# Patient Record
Sex: Male | Born: 1980 | Race: Black or African American | Hispanic: No | Marital: Single | Smoking: Current some day smoker
Health system: Southern US, Community
[De-identification: ages and names within clinical notes are randomized; demographics above are authoritative.]

## PROBLEM LIST (undated history)

## (undated) DIAGNOSIS — F319 Bipolar disorder, unspecified: Secondary | ICD-10-CM

## (undated) HISTORY — DX: Bipolar disorder, unspecified: F31.9

---

## 2000-12-14 ENCOUNTER — Emergency Department: Admit: 2000-12-14 | Payer: Self-pay | Source: Emergency Department | Admitting: Pediatric Emergency Medicine

## 2005-07-18 ENCOUNTER — Emergency Department: Admit: 2005-07-18 | Payer: Self-pay | Source: Emergency Department | Admitting: Emergency Medicine

## 2005-07-23 ENCOUNTER — Emergency Department: Admit: 2005-07-23 | Payer: Self-pay | Source: Emergency Department | Admitting: Emergency Medicine

## 2006-08-30 ENCOUNTER — Emergency Department: Admit: 2006-08-30 | Payer: Self-pay | Source: Emergency Department | Admitting: Emergency Medicine

## 2006-08-30 LAB — CBC WITH AUTO DIFFERENTIAL CERNER
Basophils Absolute: 0 /mm3 (ref 0.0–0.2)
Basophils: 0 % (ref 0–2)
Eosinophils Absolute: 0.1 /mm3 (ref 0.0–0.2)
Eosinophils: 1 % (ref 0–5)
Granulocytes Absolute: 11.1 /mm3 — ABNORMAL HIGH (ref 1.8–8.1)
Hematocrit: 42.3 % (ref 42.0–52.0)
Hgb: 14.8 G/DL (ref 13.0–17.0)
Lymphocytes Absolute: 0.9 /mm3 (ref 0.5–4.4)
Lymphocytes: 7 % — ABNORMAL LOW (ref 15–41)
MCH: 31.1 PG (ref 28.0–32.0)
MCHC: 34.9 G/DL (ref 32.0–36.0)
MCV: 89 FL (ref 80.0–100.0)
MPV: 8.6 FL (ref 7.4–10.4)
Monocytes Absolute: 0.8 /mm3 (ref 0.0–1.2)
Monocytes: 6 % (ref 0–11)
Neutrophils %: 86 % — ABNORMAL HIGH (ref 52–75)
Platelets: 191 /mm3 (ref 140–400)
RBC: 4.74 /mm3 (ref 4.70–6.00)
RDW: 12.6 % (ref 11.5–15.0)
WBC: 12.9 /mm3 — ABNORMAL HIGH (ref 3.5–10.8)

## 2006-08-30 LAB — MONONUCLEOSIS SCREEN: Mono Screen: NEGATIVE

## 2011-03-12 LAB — ECG 12-LEAD
Atrial Rate: 61 {beats}/min
P Axis: 49 degrees
P-R Interval: 146 ms
Q-T Interval: 406 ms
QRS Duration: 78 ms
QTC Calculation (Bezet): 408 ms
R Axis: 74 degrees
T Axis: 28 degrees
Ventricular Rate: 61 {beats}/min

## 2015-03-03 ENCOUNTER — Encounter (HOSPITAL_COMMUNITY): Payer: Self-pay | Admitting: Emergency Medicine

## 2015-03-03 ENCOUNTER — Emergency Department (HOSPITAL_COMMUNITY)
Admission: EM | Admit: 2015-03-03 | Discharge: 2015-03-03 | Disposition: A | Discharge status: Home / Self Care | Payer: Self-pay | Attending: Emergency Medicine | Admitting: Emergency Medicine

## 2015-03-03 ENCOUNTER — Emergency Department (HOSPITAL_COMMUNITY): Payer: Self-pay

## 2015-03-03 DIAGNOSIS — M7918 Myalgia, other site: Secondary | ICD-10-CM

## 2015-03-03 DIAGNOSIS — M79671 Pain in right foot: Secondary | ICD-10-CM | POA: Insufficient documentation

## 2015-03-03 DIAGNOSIS — Z87891 Personal history of nicotine dependence: Secondary | ICD-10-CM | POA: Insufficient documentation

## 2015-03-03 DIAGNOSIS — M79642 Pain in left hand: Secondary | ICD-10-CM | POA: Insufficient documentation

## 2015-03-03 DIAGNOSIS — M7989 Other specified soft tissue disorders: Secondary | ICD-10-CM | POA: Insufficient documentation

## 2015-03-03 MED ORDER — IBUPROFEN 400 MG PO TABS
800.0000 mg | ORAL_TABLET | Freq: Once | ORAL | Status: AC
  Administered 2015-03-03: 800 mg via ORAL
  Filled 2015-03-03: qty 2

## 2015-03-03 NOTE — ED Notes (Signed)
Pt c/o pain in right foot  X's 2 days.  Also c/o bil hand pain.  Pt st's he boxes and works out everyday.

## 2015-03-03 NOTE — ED Provider Notes (Signed)
CSN: [id number]     Arrival date & time 03/03/15  1839 History  By signing my name below, I, Randy George, attest that this documentation has been prepared under the direction and in the presence of [employer], PA-C. Electronically Signed: Ronney George, ED Scribe. 03/03/2015. 8:19 PM.    Chief Complaint  Patient presents with  . Foot Pain   The history is provided by the patient. No language interpreter was used.    HPI Comments: Randy George is a 34 y.o. male who presents to the Emergency Department complaining of constant, sharp right ankle pain that began 2 days ago. He denies any known specific trauma or injury but does state that he boxes and works out daily. He notes an associated area of redness to to the top of his right foot that has been ongoing for 2 weeks. He also reports that he is having difficulty flexing and extending his foot and has had increased discomfort with ambulation.   History reviewed. No pertinent past medical history. History reviewed. No pertinent past surgical history. No family history on file. Social History  Substance Use Topics  . Smoking status: Former Games developer  . Smokeless tobacco: None  . Alcohol Use: No    Review of Systems A complete 10 system review of systems was obtained and all systems are negative except as noted in the HPI and PMH.    Allergies  Review of patient's allergies indicates not on file.  Home Medications   Prior to Admission medications   Not on File   BP 128/88 mmHg  Pulse 98  Temp(Src) 98 F (36.7 C) (Oral)  Resp 22  Ht  (1.803 m)  Wt 185 lb 1 oz (83.944 kg)  BMI 25.82 kg/m2  SpO2 97% Physical Exam  Constitutional: He is oriented to person, place, and time. He appears well-developed and well-nourished. No distress.  HENT:  Head: Normocephalic and atraumatic.  Eyes: Conjunctivae and EOM are normal.  Neck: Normal range of motion. Neck supple. No tracheal deviation present.  Cardiovascular: Normal rate,  regular rhythm and intact distal pulses.   Pulmonary/Chest: Effort normal and breath sounds normal. No respiratory distress. He has no wheezes. He has no rales. He exhibits no tenderness.  Abdominal: Soft. Bowel sounds are normal. He exhibits no distension and no mass. There is no tenderness. There is no rebound and no guarding.  Musculoskeletal: Normal range of motion. He exhibits tenderness.  Neurological: He is alert and oriented to person, place, and time.  Skin: Skin is warm and dry.     Patient has erythema in the area of tenderness on the foot, is distally neurovascular intact, excellent range of motion. Patient is also concerned about a swelling to the dorsum of the left hand. The swelling is a vein. There is no overlying signs of infection, excellent range of motion, neurovascularly intact with no focal tenderness.  Psychiatric: He has a normal mood and affect. His behavior is normal.  Nursing note and vitals reviewed.   ED Course  Procedures (including critical care time)  DIAGNOSTIC STUDIES: Oxygen Saturation is 97% on RA, normal by my interpretation.    COORDINATION OF CARE: 7:55 PM - Discussed treatment plan with pt at bedside which includes right foot XR. Will also give pain-relieving medication and compression sleeve here. Pt verbalized understanding and agreed to plan.   Imaging Review No results found. I have personally reviewed and evaluated these images and lab results as part of my medical decision-making.  MDM   Final diagnoses:  Musculoskeletal pain    Filed Vitals:   03/03/15 1936 03/03/15 2129  BP: 128/88 118/85  Pulse: 98 81  Temp: 98 F (36.7 C) 99.1 F (37.3 C)  TempSrc: Oral Oral  Resp: 22 18  Height:  (1.803 m)   Weight: 185 lb 1 oz (83.944 kg)   SpO2: 97% 98%    Medications  ibuprofen (ADVIL,MOTRIN) tablet 800 mg (800 mg Oral Given 03/03/15 2030)    Randy George is a pleasant 34 y.o. male presenting with right foot and left hand  pain for several weeks. Patient is training for a boxing match, states that the foot pain as well as most disturbing to him and most severe. Patient is into the tori without issue, neurovascularly intact, x-rays negative. Patient has full range of motion to the hand and also neurovascularly intact there, think he needs imaging on that. States that he feels he is concerned about the swelling there which is actually vein.  Evaluation does not show pathology that would require ongoing emergent intervention or inpatient treatment. Pt is hemodynamically stable and mentating appropriately. Discussed findings and plan with patient/guardian, who agrees with care plan. All questions answered. Return precautions discussed and outpatient follow up given.   I personally performed the services described in this documentation, which was scribed in my presence. The recorded information has been reviewed and is accurate.    Wynetta Emery, PA-C 03/03/15 2257  Mancel Bale, MD 03/03/15 2329

## 2015-03-03 NOTE — Discharge Instructions (Signed)
Rest, Ice intermittently (in the first 24-48 hours), Gentle compression with an Ace wrap, and elevate (Limb above the level of the heart)   Take up to 800mg  of ibuprofen (that is usually 4 over the counter pills)  3 times a day for 5 days. Take with food.  Do not hesitate to return to the emergency room for any new, worsening or concerning symptoms.  Please obtain primary care using resource guide below. Let them know that you were seen in the emergency room and that they will need to obtain records for further outpatient management.   Muscle Pain, Adult Muscle pain (myalgia) may be caused by many things, including:  Overuse or muscle strain, especially if you are not in shape. This is the most common cause of muscle pain.  Injury.  Bruises.  Viruses, such as the flu.  Infectious diseases.  Fibromyalgia, which is a chronic condition that causes muscle tenderness, fatigue, and headache.  Autoimmune diseases, including lupus.  Certain drugs, including ACE inhibitors and statins. Muscle pain may be mild or severe. In most cases, the pain lasts only a short time and goes away without treatment. To diagnose the cause of your muscle pain, your health care provider will take your medical history. This means he or she will ask you when your muscle pain began and what has been happening. If you have not had muscle pain for very long, your health care provider may want to wait before doing much testing. If your muscle pain has lasted a long time, your health care provider may want to run tests right away. If your health care provider thinks your muscle pain may be caused by illness, you may need to have additional tests to rule out certain conditions.  Treatment for muscle pain depends on the cause. Home care is often enough to relieve muscle pain. Your health care provider may also prescribe anti-inflammatory medicine. HOME CARE INSTRUCTIONS Watch your condition for any changes. The following  actions may help to lessen any discomfort you are feeling:  Only take over-the-counter or prescription medicines as directed by your health care provider.  Apply ice to the sore muscle:  Put ice in a plastic bag.  Place a towel between your skin and the bag.  Leave the ice on for 15-20 minutes, 3-4 times a day.  You may alternate applying hot and cold packs to the muscle as directed by your health care provider.  If overuse is causing your muscle pain, slow down your activities until the pain goes away.  Remember that it is normal to feel some muscle pain after starting a workout program. Muscles that have not been used often will be sore at first.  Do regular, gentle exercises if you are not usually active.  Warm up before exercising to lower your risk of muscle pain.  Do not continue working out if the pain is very bad. Bad pain could mean you have injured a muscle. SEEK MEDICAL CARE IF:  Your muscle pain gets worse, and medicines do not help.  You have muscle pain that lasts longer than 3 days.  You have a rash or fever along with muscle pain.  You have muscle pain after a tick bite.  You have muscle pain while working out, even though you are in good physical condition.  You have redness, soreness, or swelling along with muscle pain.  You have muscle pain after starting a new medicine or changing the dose of a medicine. SEEK IMMEDIATE MEDICAL  CARE IF:  You have trouble breathing.  You have trouble swallowing.  You have muscle pain along with a stiff neck, fever, and vomiting.  You have severe muscle weakness or cannot move part of your body. MAKE SURE YOU:   Understand these instructions.  Will watch your condition.  Will get help right away if you are not doing well or get worse.   This information is not intended to replace advice given to you by your health care provider. Make sure you discuss any questions you have with your health care provider.     Document Released: 04/07/2006 Document Revised: 06/06/2014 Document Reviewed: 03/12/2013 Elsevier Interactive Patient Education 2016 [employer].   Emergency Department Resource Guide 1) Find a Doctor and Pay Out of Pocket Although you won't have to find out who is covered by your insurance plan, it is a good idea to ask around and get recommendations. You will then need to call the office and see if the doctor you have chosen will accept you as a new patient and what types of options they offer for patients who are self-pay. Some doctors offer discounts or will set up payment plans for their patients who do not have insurance, but you will need to ask so you aren't surprised when you get to your appointment.  2) Contact Your Local Health Department Not all health departments have doctors that can see patients for sick visits, but many do, so it is worth a call to see if yours does. If you don't know where your local health department is, you can check in your phone book. The CDC also has a tool to help you locate your state's health department, and many state websites also have listings of all of their local health departments.  3) Find a Walk-in Clinic If your illness is not likely to be very severe or complicated, you may want to try a walk in clinic. These are popping up all over the country in pharmacies, drugstores, and shopping centers. They're usually staffed by nurse practitioners or physician assistants that have been trained to treat common illnesses and complaints. They're usually fairly quick and inexpensive. However, if you have serious medical issues or chronic medical problems, these are probably not your best option.  No Primary Care Doctor: - Call Health Connect at  [phone] - they can help you locate a primary care doctor that  accepts your insurance, provides certain services, etc. - Physician Referral Service- [phone]  Chronic Pain Problems: Organization          Address  Phone   Notes  [facility]  [phone] Patients need to be referred by their primary care doctor.   Medication Assistance: Organization         Address  Phone   Notes  [facility] Medication [facility] [address] [place] [place], [place] [address] [phone] --Must be a resident of [facility] -- Must have NO insurance coverage whatsoever (no Medicaid/ Medicare, etc.) -- The pt. MUST have a primary care doctor that directs their care regularly and follows them in the community   [facility]  [phone]   [employer]  [phone]    Agencies that provide inexpensive medical care: Organization         Address  Phone   Notes  [facility]  [phone]   [facility]    [phone]   [facility]  [address] [place], [place] [address] [phone]   [facility] [address], [place] [phone]   Planned Parenthood    [phone]   [facility]    [phone]   [facility]  [address], [place] Phone:  [phone], Fax:  [phone] Hours of Operation:  9 am - 6 pm, M-F.  Also accepts Medicaid/Medicare and self-pay.  [facility]  [address], [place] Phone: [phone], Fax: [phone]. Hours of Operation:  8:30 am - 5:30 pm, M-F.  Also accepts Medicaid and self-pay.  [facility] [place] [address], [place] Phone: [phone]   [facility] [address] Natasha Bence [place], [place] [phone], Ext. [phone] Mondays & Thursdays: 7-9 AM.  First 15 patients are seen on a first come, first serve basis.    Medicaid-accepting [facility] Providers:  Organization         Address  Phone   Notes  [facility] [address], [place] [phone] Also accepts self-pay patients.  [facility] [address] Laurell Josephs [place], [place]  [phone]   [facility] [address], [place] [phone]   [facility] [address] [place] [phone]   Renaye Rakers [address], [place]   [phone] Only accepts [place] Access [place] patients after they have their name applied to their card.   Self-Pay (no insurance) in [facility]:  Organization         Address  Phone   Notes  Sickle Cell Patients, [facility] [address] [place], [place] [phone]   [facility] [address] [place], [place] [phone]   [facility] [place]  [address], [place] [phone]   [facility]/Dr. Osei-Bonsu  [address], [place] or [address], [place] [phone] Phone number for both [place] and [place] locations is the same.  [facility] [address], [place] [phone]   [facility] [address] [place] or [address] [phone]   [facility] [address] [place] [phone], phone; [phone], fax Sees patients 1st and 3rd Saturday of every month.  Must not qualify for public or private insurance (i.e. Medicaid, Medicare, [place] Health Choice, Veterans' Benefits)  Household income should be no more than 200% of the poverty level The clinic cannot treat you if you are pregnant or think you are pregnant  Sexually transmitted diseases are not treated at the clinic.    Dental Care: Organization         Address  Phone  Notes  [facility] Department of [facility] [address] [place], [place] [phone] Accepts children up to age 75 who are enrolled in [place] or [place] Health Choice; pregnant women with a Medicaid card; and  children who have applied for Medicaid or [place] Health Choice, but were declined, whose parents can pay a reduced fee at time of service.  [facility] Department of [facility]  [address], [place] [phone] Accepts children up to age 17 who are enrolled in [place]  or [place] Health Choice; pregnant women with a Medicaid card; and children who have applied for Medicaid or [place] Health Choice, but were declined, whose parents can pay a reduced fee at time of service.  [facility]  [address] [place], [place] [phone] Patients are seen by appointment only. Walk-ins are not accepted. [facility] will see patients 68 years of age and older. Monday - Tuesday (8am-5pm) Most Wednesdays (8:30-5pm) $30 per visit, cash only  [facility] Adult Dental Access PROGRAM  [address], [facility] [phone] Patients are seen by appointment only. Walk-ins are not accepted. [facility] will see patients 84 years of age and older. One Wednesday Evening (Monthly: Volunteer Based).  $30 per visit, cash only  [employer]  [phone] for adults; Children under age 84, call Graduate Pediatric Dentistry at [phone]. Children aged 31-14, please call [phone] to request a pediatric application.  Dental services are provided in all areas of dental care including fillings, crowns and bridges, complete and partial dentures, implants, gum treatment, root canals, and extractions. Preventive care is also provided. Treatment is provided to both adults and children. Patients are selected via a lottery and there is often a waiting list.   [facility] [address], [place]  [phone] [email or web address]   [facility] [address] [place], [place] [phone], Ext. [phone] Second and Fourth Thursday of each month, opens at 6:30 AM; Clinic ends at 9 AM.  Patients are seen on a first-come first-served  basis, and a limited number are seen during each clinic.   [facility]  [address] Ether Griffins [place], [place] [phone]   Eligibility Requirements You must have lived in [place], [place], or [place] counties for at least the last three months.   You cannot be eligible for state or federal sponsored [place], including [employer], [place], or [employer].   You generally cannot be eligible for healthcare insurance through your employer.    How to apply: Eligibility screenings are held every Tuesday and Wednesday afternoon from 1:00 pm until 4:00 pm. You do not need an appointment for the interview!  [facility] [address] [place], [place] [phone]   [facility] Health Department  [phone]   [facility] Health Department  [phone]   [facility] Health Department  [phone]    Behavioral Health Resources in the Community: Intensive Outpatient Programs Organization         Address  Phone  Notes  [facility] [address], [place], [place] [phone]   [facility] [address] [place], [place] [phone]   [facility] [address] [place], [place]  [phone]   [facility] Mental Health [address],  [place], [place] [phone] or [phone]   Substance Abuse Resources Organization         Address  Phone  Notes  [facility] Drug Services  4580998196   [facility]  [phone]   [facility]  [phone]   Floydene Flock  [phone]   Residential & Outpatient Substance Abuse Program  [phone]   Psychological Services Organization         Address  Phone  Notes  [facility]  [phone]   [facility]  [phone]   [facility] Mental Health [address], [place] [phone] or [phone]  Mobile Crisis Teams Organization          Address  Phone  Notes  [facility], Mobile Crisis Care Unit  [phone]   [facility]  [address] [place], [place] [phone]   Doristine Locks [address] [place] [phone]    Self-Help/Support Groups Organization         Address  Phone             Notes  [facility] [place] - variety of support groups  336- I7437963 Call for more information  Narcotics Anonymous (NA), [facility] [address], [employer] [place]  2 meetings at this location   Statistician         Address  Phone  Notes  [facility] [address],    [place]  [phone]   [facility]  [address] [place] [address], [place], [place] [phone]   [facility] [address] [place] [address] [place], [place] [phone] Admissions: 8am-3pm M-F  [facility] [address],    [place], [place] [phone]   [facility] [address] [place], [place], [place] [phone]   [facility] [address],  [place], [place] [phone]   [facility] - Intensive Outpatient [address] Laurell Josephs [address], [place], [place] [phone]   [facility] ([facility] [address] [place]  [place], [place] [phone] or [phone]   [facility] ([facility]) [address], [place], [place] [phone] Accepts Medicaid  [facility] [address]  [place] [phone] Substance Abuse/Addiction Treatment   [facility] Organization         Address  Phone  Notes  [facility]  [phone]   Angie Fava, PhD [address] [place], [place]   [phone] or [phone]   [facility] Behavioral   [address] [place], [place] [phone]   [facility] [address], [place], [place] [phone] Insurance/Medicaid/sponsorship  through [facility] [address]                                    [place], [place] [phone] Therapy/tele-psych/case  [facility] [address], [place] [phone]    Dr. Lolly Mustache  [phone]   [facility] [place] [facility]  [employer] [facility] Dept. 1) [address], [place] 2) [address], [place] 3)  [address], [place] [phone]  [phone]   [facility] Child Abuse Hotline 873-043-3518 or [phone] (After Hours)

## 2015-04-30 DIAGNOSIS — Z5900 Homelessness unspecified: Secondary | ICD-10-CM

## 2015-05-01 ENCOUNTER — Encounter (HOSPITAL_COMMUNITY): Payer: Self-pay | Admitting: Emergency Medicine

## 2015-05-01 ENCOUNTER — Emergency Department (HOSPITAL_COMMUNITY)
Admission: EM | Admit: 2015-05-01 | Discharge: 2015-05-01 | Disposition: A | Discharge status: Home / Self Care | Payer: Self-pay | Attending: Emergency Medicine | Admitting: Emergency Medicine

## 2015-05-01 DIAGNOSIS — Z79899 Other long term (current) drug therapy: Secondary | ICD-10-CM | POA: Insufficient documentation

## 2015-05-01 DIAGNOSIS — J029 Acute pharyngitis, unspecified: Secondary | ICD-10-CM | POA: Insufficient documentation

## 2015-05-01 DIAGNOSIS — Z87891 Personal history of nicotine dependence: Secondary | ICD-10-CM | POA: Insufficient documentation

## 2015-05-01 MED ORDER — PREDNISONE 20 MG PO TABS: 40.0000 mg | ORAL_TABLET | Freq: Every day | ORAL | Status: AC

## 2015-05-01 MED ORDER — HYDROCODONE-ACETAMINOPHEN 5-325 MG PO TABS: 1.0000 | ORAL_TABLET | ORAL | Status: AC | PRN

## 2015-05-01 MED ORDER — AMOXICILLIN 500 MG PO CAPS: 500.0000 mg | ORAL_CAPSULE | Freq: Three times a day (TID) | ORAL | Status: AC

## 2015-05-01 NOTE — ED Provider Notes (Signed)
CSN: [id number]     Arrival date & time 05/01/15  1227 History  By signing my name below, I, Freida Busmaniana Omoyeni, attest that this documentation has been prepared under the direction and in the presence of non-physician practitioner, Arthor CaptainAbigail Mareo Portilla, PA-C. Electronically Signed: Freida Busmaniana Omoyeni, Scribe. 05/01/2015. 1:04 PM.  Chief Complaint  Patient presents with  . Sore Throat   The history is provided by the patient. No language interpreter was used.   HPI Comments:  Randy George is a 34 y.o. male who presents to the Emergency Department complaining of sore throat for 3 days with 9/10 pain. He reports increased pain when swallowing. He notes occasional cough. He denies voice change, nausea, vomiting and diarrhea. No alleviating factors noted.   History reviewed. No pertinent past medical history. History reviewed. No pertinent past surgical history. No family history on file. Social History  Substance Use Topics  . Smoking status: Former Games developermoker  . Smokeless tobacco: None  . Alcohol Use: No    Review of Systems  Constitutional: Negative for fever and chills.  HENT: Positive for sore throat.   Respiratory: Negative for shortness of breath.   Cardiovascular: Negative for chest pain.   Allergies  Review of patient's allergies indicates no known allergies.  Home Medications   Prior to Admission medications   Medication Sig Start Date End Date Taking? Authorizing Provider  DM-Doxylamine-Acetaminophen (NYQUIL COLD & FLU PO) Take 2 capsules by mouth daily.   Yes Historical Provider, MD  Pheniramine-PE-APAP (THERAFLU FLU & SORE THROAT PO) Take 1 packet by mouth daily.   Yes Historical Provider, MD   BP 123/81 mmHg  Pulse 92  Temp(Src) 101.3 F (38.5 C) (Oral)  Resp 18  Ht 5\' 11"  (1.803 m)  Wt 187 lb 1.6 oz (84.868 kg)  BMI 26.11 kg/m2  SpO2 100% Physical Exam  Constitutional: He is oriented to person, place, and time. He appears well-developed and well-nourished. No distress.   HENT:  Head: Normocephalic and atraumatic.  Mouth/Throat: Uvula swelling present. Oropharyngeal exudate and posterior oropharyngeal erythema present.  Eyes: Conjunctivae are normal.  Cardiovascular: Normal rate.   Pulmonary/Chest: Effort normal.  Abdominal: He exhibits no distension.  Lymphadenopathy:    He has cervical adenopathy.  Neurological: He is alert and oriented to person, place, and time.  Skin: Skin is warm and dry.  Psychiatric: He has a normal mood and affect.  Nursing note and vitals reviewed.   ED Course  Procedures   DIAGNOSTIC STUDIES:  Oxygen Saturation is 100% on RA, normal by my interpretation.    COORDINATION OF CARE:  1:02 PM Discussed treatment plan with pt at bedside and pt agreed to plan.   MDM   Final diagnoses:  Pharyngitis   Pt with pharyngitis. Pt is tolerating secretions. Presentation not concerning for peritonsillar abscess or spread of infection to deep spaces of the throat; patent airway. Pt will be discharged with amoxicillin, Norco, and prednisone.  Specific return precautions discussed. Recommended PCP follow up.  Specific return precautions discussed.  Recommended PCP follow up. Pt appears safe for discharge.    I personally performed the services described in this documentation, which was scribed in my presence. The recorded information has been reviewed and is accurate.       Arthor Captainbigail Samanvitha Germany, PA-C 05/01/15 1309  Pricilla LovelessScott Goldston, MD 05/04/15 [phone]

## 2015-05-01 NOTE — ED Notes (Signed)
Pt from home for eval of sore throat x3 days, no n/v/d at this time. Airway intact.

## 2015-05-01 NOTE — Discharge Instructions (Signed)

## 2015-05-11 NOTE — Congregational Nurse Program (Signed)
Congregational Nurse Program Note  Date of Encounter: 04/30/2015  Past Medical History: No past medical history on file.  Encounter Details:     CNP Questionnaire - 04/30/15 1525    Patient Demographics   Is this a new or existing patient? New   Patient is considered a/an Not Applicable   Patient Assistance   Patient's financial/insurance status Uninsured;Low Income   Patient referred to apply for the following financial assistance Not Applicable   Food insecurities addressed Provided food supplies   Transportation assistance No   Assistance securing medications No   Educational health offerings Acute disease   Encounter Details   Primary purpose of visit Acute Illness/Condition Visit   Was an Emergency Department visit averted? No   Does patient have a medical provider? No   Patient referred to Clinic   Was a mental health screening completed? (GAINS tool) No   Does patient have dental issues? No   Since previous encounter, have you referred patient for abnormal blood pressure that resulted in a new diagnosis or medication change? No   Since previous encounter, have you referred patient for abnormal blood glucose that resulted in a new diagnosis or medication change? No   For Abstraction Use Only   Does patient have insurance? No     Clinic visit complaining of sore throat, and general malaise.  Throat reddened.  Afebrile.  Instructed to rest - note provided to shelter to allow periods of bed rest for the next two days.  [facility] NP Lavinia SharpsMary Ann Placey if symptoms worsen.

## 2015-07-17 ENCOUNTER — Encounter (HOSPITAL_COMMUNITY): Payer: Self-pay | Admitting: Family Medicine

## 2015-07-17 ENCOUNTER — Emergency Department (HOSPITAL_COMMUNITY): Payer: BLUE CROSS/BLUE SHIELD

## 2015-07-17 ENCOUNTER — Emergency Department (HOSPITAL_COMMUNITY)
Admission: EM | Admit: 2015-07-17 | Discharge: 2015-07-17 | Disposition: A | Discharge status: Home / Self Care | Payer: BLUE CROSS/BLUE SHIELD | Attending: Emergency Medicine | Admitting: Emergency Medicine

## 2015-07-17 DIAGNOSIS — Z79899 Other long term (current) drug therapy: Secondary | ICD-10-CM | POA: Insufficient documentation

## 2015-07-17 DIAGNOSIS — Z792 Long term (current) use of antibiotics: Secondary | ICD-10-CM | POA: Diagnosis not present

## 2015-07-17 DIAGNOSIS — S29001A Unspecified injury of muscle and tendon of front wall of thorax, initial encounter: Secondary | ICD-10-CM | POA: Insufficient documentation

## 2015-07-17 DIAGNOSIS — Z7952 Long term (current) use of systemic steroids: Secondary | ICD-10-CM | POA: Diagnosis not present

## 2015-07-17 DIAGNOSIS — Y998 Other external cause status: Secondary | ICD-10-CM | POA: Diagnosis not present

## 2015-07-17 DIAGNOSIS — Y92410 Unspecified street and highway as the place of occurrence of the external cause: Secondary | ICD-10-CM | POA: Diagnosis not present

## 2015-07-17 DIAGNOSIS — Y9389 Activity, other specified: Secondary | ICD-10-CM | POA: Insufficient documentation

## 2015-07-17 DIAGNOSIS — R0781 Pleurodynia: Secondary | ICD-10-CM

## 2015-07-17 DIAGNOSIS — S299XXA Unspecified injury of thorax, initial encounter: Secondary | ICD-10-CM | POA: Diagnosis present

## 2015-07-17 DIAGNOSIS — Z87891 Personal history of nicotine dependence: Secondary | ICD-10-CM | POA: Insufficient documentation

## 2015-07-17 DIAGNOSIS — S4992XA Unspecified injury of left shoulder and upper arm, initial encounter: Secondary | ICD-10-CM | POA: Insufficient documentation

## 2015-07-17 MED ORDER — IBUPROFEN 800 MG PO TABS: 800.0000 mg | ORAL_TABLET | Freq: Three times a day (TID) | ORAL | Status: AC
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
## 2015-07-17 NOTE — Discharge Instructions (Signed)
Costochondritis  Costochondritis is a condition in which the tissue (cartilage) that connects your ribs with your breastbone (sternum) becomes irritated. It causes pain in the chest and rib area. It usually goes away on its own over time.  HOME CARE  · Avoid activities that wear you out.  · Do not strain your ribs. Avoid activities that use your:    Chest.    Belly.    Side muscles.  · Put ice on the area for the first 2 days after the pain starts.    Put ice in a plastic bag.    Place a towel between your skin and the bag.    Leave the ice on for 20 minutes, 2-3 times a day.  · Only take medicine as told by your doctor.  GET HELP IF:  · You have redness or puffiness (swelling) in the rib area.  · Your pain does not go away with rest or medicine.  GET HELP RIGHT AWAY IF:   · Your pain gets worse.  · You are very uncomfortable.  · You have trouble breathing.  · You cough up blood.  · You start sweating or throwing up (vomiting).  · You have a fever or lasting symptoms for more than 2-3 days.  · You have a fever and your symptoms suddenly get worse.  MAKE SURE YOU:   · Understand these instructions.  · Will watch your condition.  · Will get help right away if you are not doing well or get worse.     This information is not intended to replace advice given to you by your health care provider. Make sure you discuss any questions you have with your health care provider.     Document Released: 11/02/2007 Document Revised: 01/16/2013 Document Reviewed: 12/18/2012  Elsevier Interactive Patient Education ©2016 [employer]

## 2015-07-17 NOTE — ED Notes (Signed)
Patient transported to X-ray 

## 2015-07-17 NOTE — ED Notes (Signed)
Pt here for pain under left arm that started after pushing something heavy. 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

## 2016-01-10 ENCOUNTER — Emergency Department
Admission: EM | Admit: 2016-01-10 | Discharge: 2016-01-10 | Disposition: A | Discharge status: Home / Self Care | Payer: Self-pay | Attending: Emergency Medicine | Admitting: Emergency Medicine

## 2016-01-10 ENCOUNTER — Emergency Department: Payer: Self-pay

## 2016-01-10 DIAGNOSIS — S61011A Laceration without foreign body of right thumb without damage to nail, initial encounter: Secondary | ICD-10-CM | POA: Insufficient documentation

## 2016-01-10 DIAGNOSIS — W228XXA Striking against or struck by other objects, initial encounter: Secondary | ICD-10-CM | POA: Insufficient documentation

## 2016-01-10 DIAGNOSIS — Z79899 Other long term (current) drug therapy: Secondary | ICD-10-CM | POA: Insufficient documentation

## 2016-01-10 DIAGNOSIS — S0502XA Injury of conjunctiva and corneal abrasion without foreign body, left eye, initial encounter: Secondary | ICD-10-CM | POA: Insufficient documentation

## 2016-01-10 DIAGNOSIS — Y9302 Activity, running: Secondary | ICD-10-CM | POA: Insufficient documentation

## 2016-01-10 DIAGNOSIS — W25XXXA Contact with sharp glass, initial encounter: Secondary | ICD-10-CM | POA: Insufficient documentation

## 2016-01-10 DIAGNOSIS — F17200 Nicotine dependence, unspecified, uncomplicated: Secondary | ICD-10-CM | POA: Insufficient documentation

## 2016-01-10 MED ORDER — LIDOCAINE HCL (PF) 2 % IJ SOLN
10.0000 mL | Freq: Once | INTRAMUSCULAR | Status: AC
Start: 2016-01-10 — End: 2016-01-10
  Administered 2016-01-10: 10 mL via INTRADERMAL
  Filled 2016-01-10: qty 10

## 2016-01-10 MED ORDER — TETRACAINE HCL 0.5 % OP SOLN
2.0000 [drp] | Freq: Once | OPHTHALMIC | Status: AC
Start: 2016-01-10 — End: 2016-01-10
  Administered 2016-01-10: 2 [drp] via OPHTHALMIC
  Filled 2016-01-10: qty 2

## 2016-01-10 MED ORDER — ERYTHROMYCIN 5 MG/GM OP OINT
TOPICAL_OINTMENT | Freq: Once | OPHTHALMIC | Status: AC
Start: 2016-01-10 — End: 2016-01-10
  Filled 2016-01-10: qty 3.5

## 2016-01-10 MED ORDER — CEPHALEXIN 500 MG PO CAPS
1000.0000 mg | ORAL_CAPSULE | Freq: Two times a day (BID) | ORAL | Status: AC
Start: 2016-01-10 — End: 2016-01-17

## 2016-01-10 MED ORDER — CEPHALEXIN 250 MG PO CAPS
1000.0000 mg | ORAL_CAPSULE | Freq: Once | ORAL | Status: AC
Start: 2016-01-10 — End: 2016-01-10
  Administered 2016-01-10: 1000 mg via ORAL
  Filled 2016-01-10: qty 4

## 2016-01-10 MED ORDER — FLUORESCEIN SODIUM 1 MG OP STRP
1.0000 | ORAL_STRIP | Freq: Once | OPHTHALMIC | Status: AC
Start: 2016-01-10 — End: 2016-01-10
  Administered 2016-01-10: 1 via OPHTHALMIC
  Filled 2016-01-10: qty 1

## 2016-01-10 MED ORDER — ERYTHROMYCIN 5 MG/GM OP OINT
TOPICAL_OINTMENT | Freq: Four times a day (QID) | OPHTHALMIC | Status: AC
Start: 2016-01-10 — End: 2016-01-15

## 2016-01-10 NOTE — Discharge Instructions (Signed)
1. Return immediately if worse in any way.    2. Follow up with your primary medical doctor and opthalmology for recheck.    3. Sutures need to be removed in 14 days.  Follow up with your primary medical doctor or return in 2 weeks for suture removal.      Corneal Abrasion    You have been diagnosed with a corneal abrasion. This is a scratch on the eye.    The cornea is the clear part on the surface of the eye. It is in the center of your eye. It is right over the colored part (the iris).    The cornea has many nerve endings. Even a small scratch can cause a lot of pain.    The cornea is one of the fastest healing areas of the body. Corneal abrasions often heal within a day.    The cornea can get infected after a minor injury. Therefore, antibiotic eye drops or ointments are usually prescribed. Use the antibiotic as directed.    Most abrasions heal quickly without complications. Therefore, follow-up may not be needed. The doctor will decide if follow-up is needed.    Follow up with your doctor or referral ophthalmologist (eye doctor) as directed.    DO NOT USE your contact lenses in the affected eye.    You should always have eye glasses to use as a backup whenever you have eye problems. This is because you should never wear contact lenses when your eye is irritated.    An eye patch was used. This will change how you see. It will also have a big effect on your depth perception. DO NOT drive or operate heavy machinery while wearing the patch. Be careful when walking up or down stairs. If pain gets worse while wearing the eye patch, call your doctor or return here or to the nearest Emergency Department right away.    YOU SHOULD SEEK MEDICAL ATTENTION IMMEDIATELY, EITHER HERE OR AT THE NEAREST EMERGENCY DEPARTMENT, IF ANY OF THE FOLLOWING OCCURS:   Vision gets worse or no improvement after 24 hours.   More eye pain and / or high light sensitivity.   Drainage from the eye.    If you can t follow up with  your doctor, or if at any time you feel you need to be rechecked or seen again, come back here or go to the nearest emergency department.

## 2016-01-10 NOTE — ED Notes (Signed)
Patient BIBA and PD for laceration to right thumb and redness to his left eye. Patient is under police custody and [employer] at bedside.

## 2016-01-10 NOTE — ED Notes (Signed)
Bed: E25  Expected date:   Expected time:   Means of arrival:   Comments:  Medic 424 - laceration
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
## 2016-03-16 LAB — CBC AND DIFFERENTIAL
Absolute NRBC: 0 10*3/uL
Basophils Absolute Automated: 0.02 10*3/uL (ref 0.00–0.20)
Basophils Automated: 0.3 %
Eosinophils Absolute Automated: 0.19 10*3/uL (ref 0.00–0.70)
Eosinophils Automated: 3.2 %
Hematocrit: 44.8 % (ref 42.0–52.0)
Hgb: 15.1 g/dL (ref 13.0–17.0)
Immature Granulocytes Absolute: 0.01 10*3/uL
Immature Granulocytes: 0.2 %
Lymphocytes Absolute Automated: 2.08 10*3/uL (ref 0.50–4.40)
Lymphocytes Automated: 35.3 %
MCH: 30.3 pg (ref 28.0–32.0)
MCHC: 33.7 g/dL (ref 32.0–36.0)
MCV: 89.8 fL (ref 80.0–100.0)
MPV: 12.2 fL (ref 9.4–12.3)
Monocytes Absolute Automated: 0.59 10*3/uL (ref 0.00–1.20)
Monocytes: 10 %
Neutrophils Absolute: 3 10*3/uL (ref 1.80–8.10)
Neutrophils: 51 %
Nucleated RBC: 0 /100 WBC (ref 0.0–1.0)
Platelets: 178 10*3/uL (ref 140–400)
RBC: 4.99 10*6/uL (ref 4.70–6.00)
RDW: 11 % — ABNORMAL LOW (ref 12–15)
WBC: 5.89 10*3/uL (ref 3.50–10.80)

## 2016-03-16 LAB — COMPREHENSIVE METABOLIC PANEL
ALT: 14 U/L (ref 0–55)
AST (SGOT): 20 U/L (ref 5–34)
Albumin/Globulin Ratio: 1.1 (ref 0.9–2.2)
Albumin: 4.1 g/dL (ref 3.5–5.0)
Alkaline Phosphatase: 65 U/L (ref 38–106)
BUN: 19 mg/dL (ref 9.0–28.0)
Bilirubin, Total: 0.6 mg/dL (ref 0.1–1.2)
CO2: 26 mEq/L (ref 21–30)
Calcium: 9.5 mg/dL (ref 8.5–10.5)
Chloride: 105 mEq/L (ref 100–111)
Creatinine: 1.1 mg/dL (ref 0.5–1.5)
Globulin: 3.6 g/dL (ref 2.0–3.7)
Glucose: 76 mg/dL (ref 70–100)
Potassium: 4.3 mEq/L (ref 3.5–5.1)
Protein, Total: 7.7 g/dL (ref 6.0–8.3)
Sodium: 142 mEq/L (ref 135–146)

## 2016-03-16 LAB — HEMOLYSIS INDEX: Hemolysis Index: 33 — ABNORMAL HIGH (ref 0–18)

## 2016-03-16 LAB — GFR: EGFR: >60

## 2016-03-16 LAB — VALPROIC ACID LEVEL, TOTAL: Valproic Acid Level: 76.7 ug/mL (ref 50.0–100.0)
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
